# Patient Record
Sex: Female | Born: 1980 | Race: Black or African American | Hispanic: No | Marital: Single | Smoking: Current every day smoker
Health system: Southern US, Community
[De-identification: ages and names within clinical notes are randomized; demographics above are authoritative.]

## PROBLEM LIST (undated history)

## (undated) HISTORY — PX: BACK SURGERY: SHX140

---

## 2010-04-27 ENCOUNTER — Emergency Department (HOSPITAL_COMMUNITY)
Admission: EM | Admit: 2010-04-27 | Discharge: 2010-04-27 | Disposition: A | Discharge status: Home / Self Care | Payer: BC Managed Care – PPO | Attending: Emergency Medicine | Admitting: Emergency Medicine

## 2010-04-27 ENCOUNTER — Emergency Department (HOSPITAL_COMMUNITY): Payer: BC Managed Care – PPO

## 2010-04-27 DIAGNOSIS — M79609 Pain in unspecified limb: Secondary | ICD-10-CM | POA: Insufficient documentation

## 2010-04-27 DIAGNOSIS — R071 Chest pain on breathing: Secondary | ICD-10-CM | POA: Insufficient documentation

## 2010-04-27 DIAGNOSIS — M546 Pain in thoracic spine: Secondary | ICD-10-CM | POA: Insufficient documentation

## 2010-04-27 DIAGNOSIS — R509 Fever, unspecified: Secondary | ICD-10-CM | POA: Insufficient documentation

## 2010-04-27 DIAGNOSIS — S60229A Contusion of unspecified hand, initial encounter: Secondary | ICD-10-CM | POA: Insufficient documentation

## 2010-05-10 ENCOUNTER — Emergency Department (INDEPENDENT_AMBULATORY_CARE_PROVIDER_SITE_OTHER): Payer: BC Managed Care – PPO

## 2010-05-10 ENCOUNTER — Emergency Department (HOSPITAL_BASED_OUTPATIENT_CLINIC_OR_DEPARTMENT_OTHER)
Admission: EM | Admit: 2010-05-10 | Discharge: 2010-05-10 | Disposition: A | Discharge status: Home / Self Care | Payer: BC Managed Care – PPO | Attending: Emergency Medicine | Admitting: Emergency Medicine

## 2010-05-10 DIAGNOSIS — R209 Unspecified disturbances of skin sensation: Secondary | ICD-10-CM

## 2010-05-10 DIAGNOSIS — F17200 Nicotine dependence, unspecified, uncomplicated: Secondary | ICD-10-CM | POA: Insufficient documentation

## 2010-05-10 DIAGNOSIS — R079 Chest pain, unspecified: Secondary | ICD-10-CM
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
## 2012-03-27 ENCOUNTER — Emergency Department (HOSPITAL_BASED_OUTPATIENT_CLINIC_OR_DEPARTMENT_OTHER): Payer: 59

## 2012-03-27 ENCOUNTER — Emergency Department (HOSPITAL_BASED_OUTPATIENT_CLINIC_OR_DEPARTMENT_OTHER)
Admission: EM | Admit: 2012-03-27 | Discharge: 2012-03-27 | Disposition: A | Discharge status: Home / Self Care | Payer: 59 | Attending: Emergency Medicine | Admitting: Emergency Medicine

## 2012-03-27 ENCOUNTER — Encounter (HOSPITAL_BASED_OUTPATIENT_CLINIC_OR_DEPARTMENT_OTHER): Payer: Self-pay | Admitting: *Deleted

## 2012-03-27 DIAGNOSIS — F17200 Nicotine dependence, unspecified, uncomplicated: Secondary | ICD-10-CM | POA: Insufficient documentation

## 2012-03-27 DIAGNOSIS — M7989 Other specified soft tissue disorders: Secondary | ICD-10-CM | POA: Insufficient documentation

## 2012-03-27 DIAGNOSIS — R209 Unspecified disturbances of skin sensation: Secondary | ICD-10-CM | POA: Insufficient documentation

## 2012-03-27 DIAGNOSIS — M25579 Pain in unspecified ankle and joints of unspecified foot: Secondary | ICD-10-CM | POA: Insufficient documentation

## 2012-03-27 MED ORDER — HYDROCODONE-ACETAMINOPHEN 5-325 MG PO TABS: 2.0000 | ORAL_TABLET | ORAL | Status: AC | PRN

## 2012-03-27 MED ORDER — IBUPROFEN 800 MG PO TABS: 800.0000 mg | ORAL_TABLET | Freq: Three times a day (TID) | ORAL | Status: AC

## 2012-03-27 NOTE — ED Provider Notes (Signed)
History     CSN: [id number]  Arrival date & time 03/27/12  1009   First MD Initiated Contact with Patient 03/27/12 1209      Chief Complaint  Patient presents with  . Leg Swelling    (Consider location/radiation/quality/duration/timing/severity/associated sxs/prior treatment) Patient is a 32 y.o. female presenting with ankle pain. The history is provided by the patient. No language interpreter was used.  Ankle Pain Location:  Ankle Injury: no   Ankle location:  R ankle Pain details:    Quality:  Aching and pressure   Radiates to:  Does not radiate   Severity:  Moderate   Timing:  Constant   Progression:  Worsening Chronicity:  New Foreign body present:  No foreign bodies Ineffective treatments:  None tried Pt reports pain going up her leg.   Pt also complains of tingling in her hands  History reviewed. No pertinent past medical history.  History reviewed. No pertinent past surgical history.  No family history on file.  History  Substance Use Topics  . Smoking status: Current Every Day Smoker -- 0.50 packs/day    Types: Cigarettes  . Smokeless tobacco: Not on file  . Alcohol Use: Yes     Comment: socially    OB History   Grav Para Term Preterm Abortions TAB SAB Ect Mult Living                  Review of Systems  All other systems reviewed and are negative.    Allergies  Review of patient's allergies indicates no known allergies.  Home Medications   Current Outpatient Rx  Name  Route  Sig  Dispense  Refill  . ibuprofen (ADVIL,MOTRIN) 800 MG tablet   Oral   Take 800 mg by mouth every 8 (eight) hours as needed for pain.           BP 126/86  Pulse 84  Temp(Src) 98.1 F (36.7 C) (Oral)  Resp 18  Ht 5' 7.5" (1.715 m)  Wt 235 lb (106.595 kg)  BMI 36.24 kg/m2  SpO2 98%  LMP 02/29/2012  Physical Exam  Nursing note and vitals reviewed. Constitutional: She is oriented to person, place, and time. She appears well-developed and well-nourished.   HENT:  Head: Normocephalic.  Musculoskeletal: She exhibits edema and tenderness.  Swollen ankle,  Pain with movement,  nv and ns intact  Neurological: She is alert and oriented to person, place, and time. She has normal reflexes.  Skin: Skin is warm.  Psychiatric: She has a normal mood and affect.    ED Course  Procedures (including critical care time)  Labs Reviewed - No data to display Dg Ankle Complete Right  03/27/2012  *RADIOLOGY REPORT*  Clinical Data: Swelling  RIGHT ANKLE - COMPLETE 3+ VIEW  Comparison: None.  Findings:  Frontal, oblique, and lateral views were obtained.  No fracture or effusion.  Ankle mortise appears intact.  No appreciable arthropathy.  IMPRESSION: No abnormality noted.   Original Report Authenticated By: Bretta Bang, M.D.      1. Ankle pain, right       MDM  Ibuprofen and hydrocodone.   Pt referred to Dr. Pearletha Forge for recheck.        Lonia Skinner [place], [place] 03/27/12 1413

## 2012-03-27 NOTE — ED Provider Notes (Signed)
Medical screening examination/treatment/procedure(s) were performed by non-physician practitioner and as supervising physician I was immediately available for consultation/collaboration.   Hanley Seamen, MD 03/27/12 [phone]

## 2012-03-27 NOTE — ED Notes (Signed)
Patient states she noticed swelling in her right ankle yesterday, no known injury.  States she has taken ibuprofen for swelling, and has kept elevated.  States today while at work, she developed numbness and tingling in her bilateral hands and left leg.

## 2013-12-09 IMAGING — CR DG ANKLE COMPLETE 3+V*R*
3 series · 3 of 3 positions shown · non-contrast
Comparison: None.

CLINICAL DATA: Swelling

RIGHT ANKLE - COMPLETE 3+ VIEW

[t ankle joint ap right]
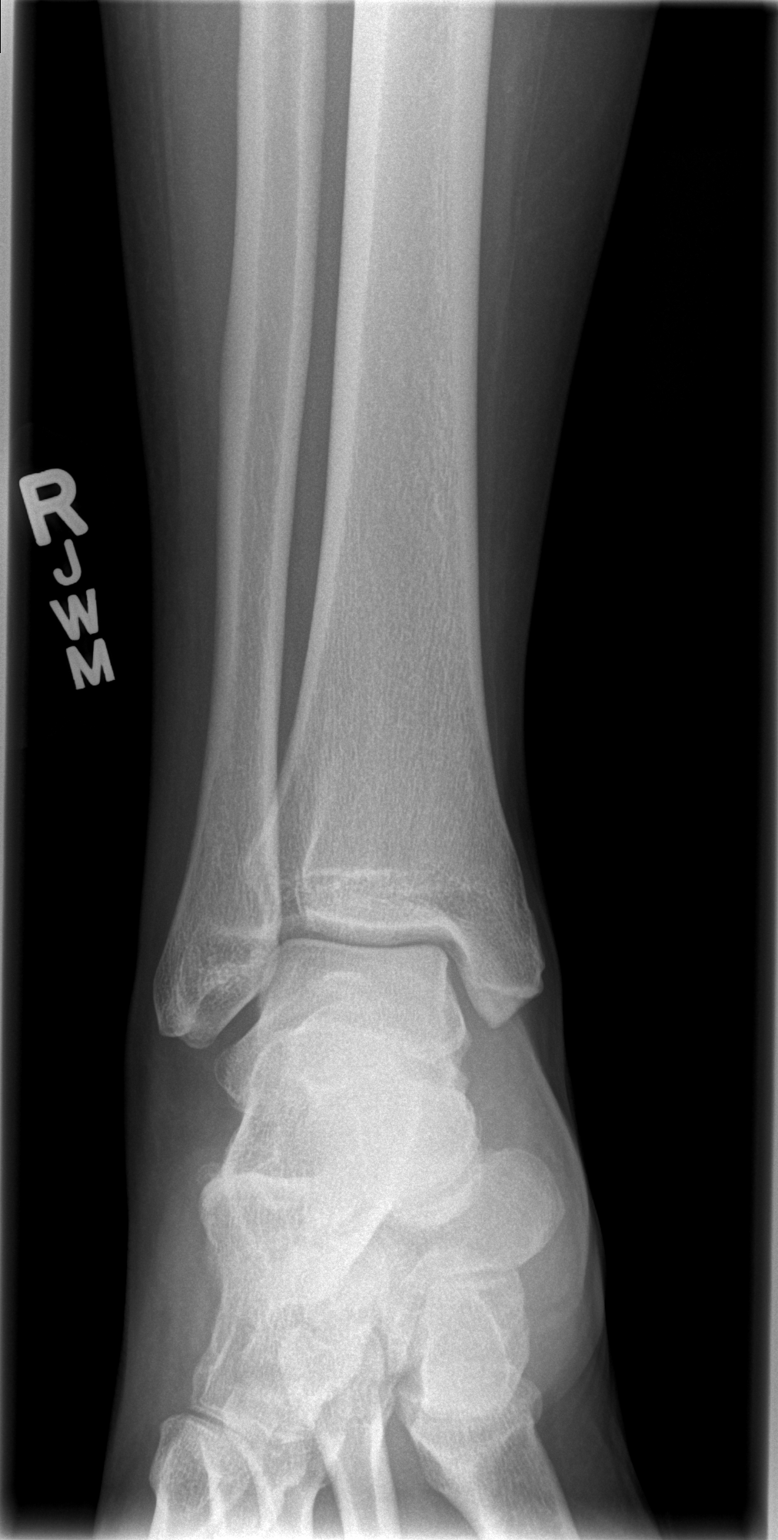

[t ankle joint oblique right]
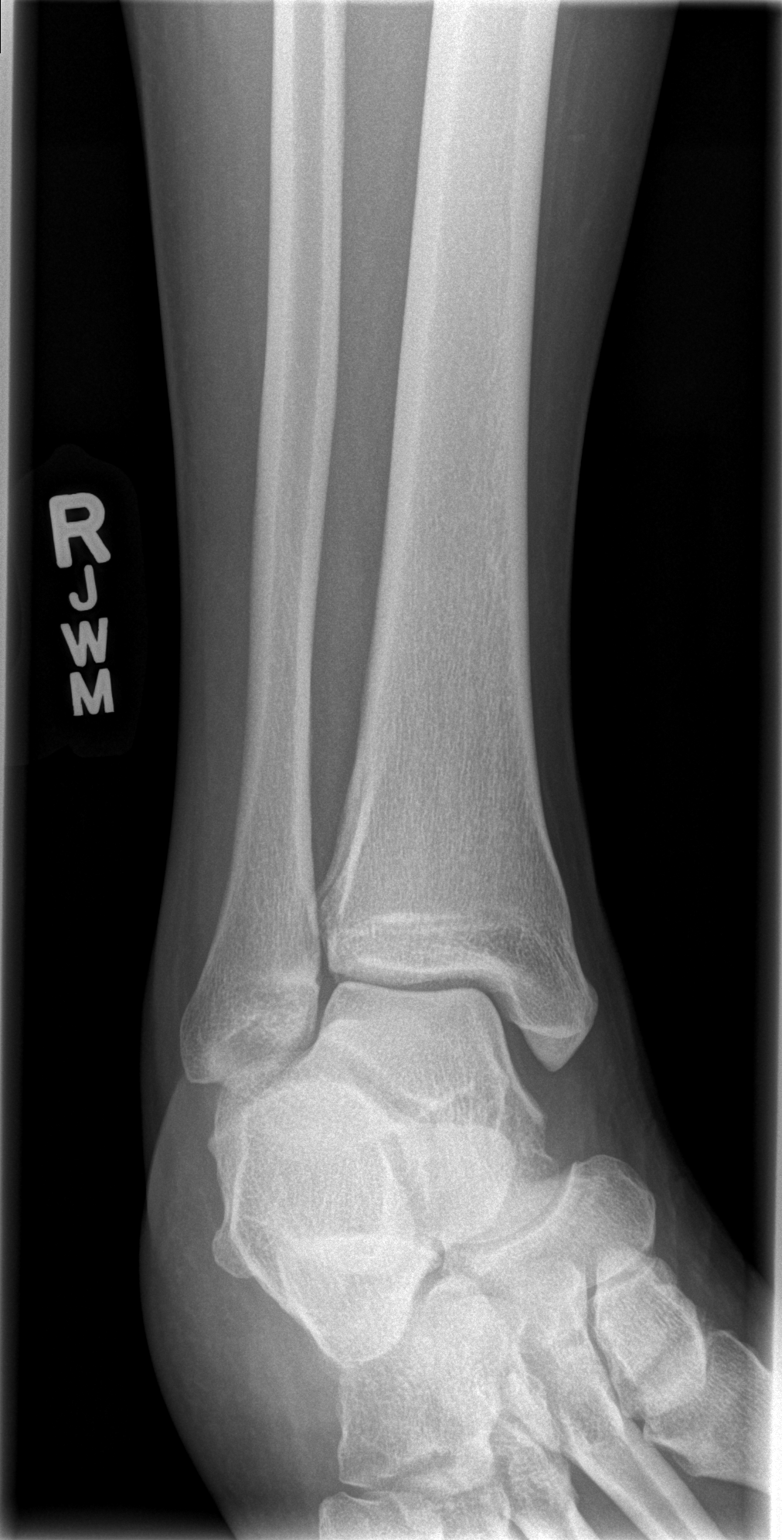

[t ankle joint lat right]
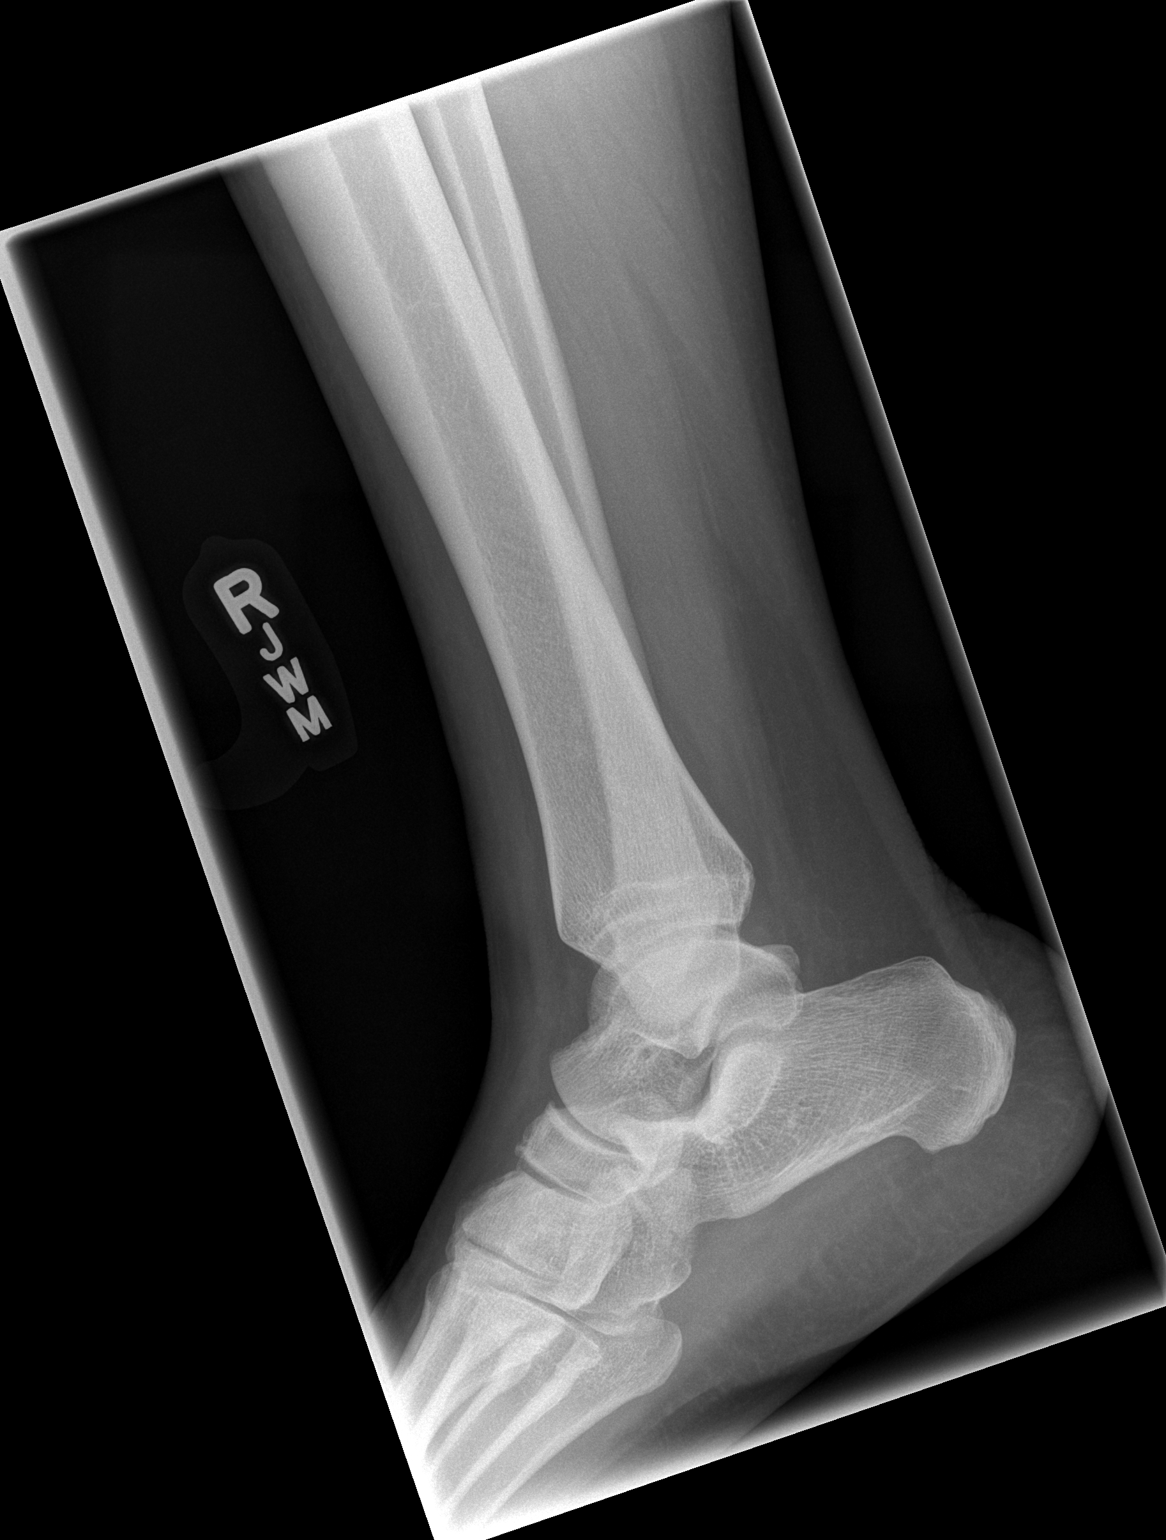

[3 of 3 positions shown; findings below may reference images not displayed]

FINDINGS: Frontal, oblique, and lateral views were obtained.  No
fracture or effusion.  Ankle mortise appears intact.  No
appreciable arthropathy.
IMPRESSION: No abnormality noted.

## 2017-10-30 ENCOUNTER — Emergency Department (HOSPITAL_BASED_OUTPATIENT_CLINIC_OR_DEPARTMENT_OTHER)
Admission: EM | Admit: 2017-10-30 | Discharge: 2017-10-30 | Disposition: A | Discharge status: Home / Self Care | Payer: Self-pay | Attending: Emergency Medicine | Admitting: Emergency Medicine

## 2017-10-30 DIAGNOSIS — M549 Dorsalgia, unspecified: Secondary | ICD-10-CM | POA: Insufficient documentation

## 2017-10-30 DIAGNOSIS — Z5321 Procedure and treatment not carried out due to patient leaving prior to being seen by health care provider: Secondary | ICD-10-CM | POA: Insufficient documentation
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
## 2017-10-30 NOTE — ED Notes (Signed)
Pt states to triage nurse Sue Lush that she cannot wait to see a doctor. Pt left ER with quick steady gait in nad.

## 2018-10-22 ENCOUNTER — Encounter (HOSPITAL_BASED_OUTPATIENT_CLINIC_OR_DEPARTMENT_OTHER): Payer: Self-pay | Admitting: Emergency Medicine

## 2018-10-22 ENCOUNTER — Other Ambulatory Visit: Payer: Self-pay

## 2018-10-22 ENCOUNTER — Emergency Department (HOSPITAL_BASED_OUTPATIENT_CLINIC_OR_DEPARTMENT_OTHER)
Admission: EM | Admit: 2018-10-22 | Discharge: 2018-10-22 | Disposition: A | Discharge status: Home / Self Care | Payer: 59 | Attending: Emergency Medicine | Admitting: Emergency Medicine

## 2018-10-22 DIAGNOSIS — F17210 Nicotine dependence, cigarettes, uncomplicated: Secondary | ICD-10-CM | POA: Diagnosis not present

## 2018-10-22 DIAGNOSIS — M25511 Pain in right shoulder: Secondary | ICD-10-CM

## 2018-10-22 MED ORDER — KETOROLAC TROMETHAMINE 30 MG/ML IJ SOLN
30.0000 mg | Freq: Once | INTRAMUSCULAR | Status: AC
  Administered 2018-10-22: 30 mg via INTRAMUSCULAR
  Filled 2018-10-22: qty 1

## 2018-10-22 MED ORDER — NAPROXEN 500 MG PO TABS: 500.0000 mg | ORAL_TABLET | Freq: Two times a day (BID) | ORAL | 0 refills | Status: AC

## 2018-10-22 MED ORDER — DIAZEPAM 5 MG PO TABS
5.0000 mg | ORAL_TABLET | Freq: Once | ORAL | Status: AC
  Administered 2018-10-22: 04:00:00 5 mg via ORAL
  Filled 2018-10-22: qty 1

## 2018-10-22 NOTE — ED Notes (Signed)
PT ambulatory. Denies N/T.

## 2018-10-22 NOTE — ED Provider Notes (Signed)
[facility] Provider Note   CSN: [id number] Arrival date & time: 10/22/18  0246     History   Chief Complaint Chief Complaint  Patient presents with  . Back Pain    HPI Anita Rivas is a 38 y.o. female.           This is a 38 year old female who presents with right shoulder and back pain.  Patient reports she has had pain since Tuesday.  She was carrying her work bag on her right shoulder and "it is too heavy" and she believes she may have pulled a muscle.  She states she has taken ibuprofen and Zanaflex at home.  She denies any neck pain.  She states that she went to bed last night woke up this morning very stiff and with 10 out of 10 pain.  She denies any chest pain.  She denies any weakness in the right upper extremity.  She does have some tingling in her hand but that is at her baseline. History reviewed. No pertinent past medical history.  There are no active problems to display for this patient.   Past Surgical History:  Procedure Laterality Date  . BACK SURGERY       OB History   No obstetric history on file.      Home Medications    Prior to Admission medications   Medication Sig Start Date End Date Taking? Authorizing Provider  tiZANidine (ZANAFLEX) 4 MG tablet Take by mouth. 04/06/18  Yes [provider]  HYDROcodone-acetaminophen (NORCO/VICODIN) 5-325 MG per tablet Take 2 tablets by mouth every 4 (four) hours as needed for pain. 03/27/12   Fransico Meadow, PA-C  ibuprofen (ADVIL,MOTRIN) 800 MG tablet Take 800 mg by mouth every 8 (eight) hours as needed for pain.    [provider]  ibuprofen (ADVIL,MOTRIN) 800 MG tablet Take 1 tablet (800 mg total) by mouth 3 (three) times daily. 03/27/12   Fransico Meadow, PA-C  naproxen (NAPROSYN) 500 MG tablet Take 1 tablet (500 mg total) by mouth 2 (two) times daily. 10/22/18   Darielys Giglia, Barbette Hair, MD    Family History No family history on file.  Social History  Social History   Tobacco Use  . Smoking status: Current Every Day Smoker    Packs/day: 0.50    Types: Cigarettes  Substance Use Topics  . Alcohol use: Yes    Comment: socially  . Drug use: No     Allergies   Patient has no known allergies.   Review of Systems Review of Systems  Respiratory: Negative for shortness of breath.   Cardiovascular: Negative for chest pain.  Musculoskeletal:       Right shoulder neck pain  Neurological: Negative for weakness and numbness.  All other systems reviewed and are negative.    Physical Exam Updated Vital Signs BP (!) 154/97 (BP Location: Right Arm)   Pulse 85   Temp 98.6 F (37 C)   Resp 18   Ht 1.702 m (5\' 7" )   Wt 113.4 kg   LMP 10/02/2018 (Approximate)   SpO2 100%   BMI 39.16 kg/m   Physical Exam Vitals signs and nursing note reviewed.  Constitutional:      Appearance: She is well-developed. She is obese.  HENT:     Head: Normocephalic and atraumatic.  Eyes:     Pupils: Pupils are equal, round, and reactive to light.  Neck:     Musculoskeletal: Normal range of motion and neck  supple.  Cardiovascular:     Rate and Rhythm: Normal rate and regular rhythm.     Heart sounds: Normal heart sounds.  Pulmonary:     Effort: Pulmonary effort is normal. No respiratory distress.     Breath sounds: No wheezing.  Abdominal:     Palpations: Abdomen is soft.     Tenderness: There is no abdominal tenderness.  Musculoskeletal:     Comments: Tenderness to palpation right trapezius and right chest wall just inferior to the clavicle, there is no overlying skin changes, normal range of motion but pain with abduction of the shoulder, 2+ radial pulse, neurovascular intact distally  Skin:    General: Skin is warm and dry.  Neurological:     Mental Status: She is alert and oriented to person, place, and time.  Psychiatric:        Mood and Affect: Mood normal.      ED Treatments / Results  Labs (all labs ordered are listed, but  only abnormal results are displayed) Labs Reviewed - No data to display  EKG None  Radiology No results found.  Procedures Procedures (including critical care time)  Medications Ordered in ED Medications  ketorolac (TORADOL) 30 MG/ML injection 30 mg (30 mg Intramuscular Given 10/22/18 0332)  diazepam (VALIUM) tablet 5 mg (5 mg Oral Given 10/22/18 0332)     Initial Impression / Assessment and Plan / ED Course  I have reviewed the triage vital signs and the nursing notes.  Pertinent labs & imaging results that were available during my care of the patient were reviewed by me and considered in my medical decision making (see chart for details).  Clinical Course as of Oct 22 434  Mon Oct 22, 2018  1610 On recheck, patient's pain now 6 out of 10.  She appears more comfortable.  Conservative measures at home.   [CH]    Clinical Course User Index [CH] Oveda Dadamo, Mayer Masker, MD       Patient presents with atraumatic pain in the right shoulder neck.  She is overall nontoxic and vital signs are reassuring.  Reports pain after carrying a heavy bag on her right shoulder.  Given exam and history, suspect musculoskeletal etiology.  Low suspicion for dislocation or fracture.  Will defer any imaging at this time.  Patient was given Toradol and Valium for muscle relaxation.  See clinical course above.  Recommend continued supportive measures at home.  After history, exam, and medical workup I feel the patient has been appropriately medically screened and is safe for discharge home. Pertinent diagnoses were discussed with the patient. Patient was given return precautions.   Final Clinical Impressions(s) / ED Diagnoses   Final diagnoses:  Acute pain of right shoulder    ED Discharge Orders         Ordered    naproxen (NAPROSYN) 500 MG tablet  2 times daily     10/22/18 0436           Okie Bogacz, Mayer Masker, MD 10/22/18 [phone]

## 2018-10-22 NOTE — Discharge Instructions (Addendum)
You were seen today for pain in the right shoulder neck.  This is likely musculoskeletal pain.  Continue your Zanaflex at home.  Take naproxen twice daily.  Use heat or ice.

## 2018-10-22 NOTE — ED Triage Notes (Signed)
Upper back pain for 4 days, no obvious injury. OTC meds not working.
